# Patient Record
Sex: Male | Born: 1995 | Race: White | Hispanic: No | Marital: Single | Smoking: Current every day smoker
Health system: Southern US, Community
[De-identification: ages and names within clinical notes are randomized; demographics above are authoritative.]

---

## 2014-12-09 ENCOUNTER — Ambulatory Visit (INDEPENDENT_AMBULATORY_CARE_PROVIDER_SITE_OTHER): Payer: Worker's Compensation | Admitting: Emergency Medicine

## 2014-12-09 ENCOUNTER — Ambulatory Visit: Payer: Worker's Compensation

## 2014-12-09 VITALS — BP 128/80 | HR 95 | Temp 98.8°F | Resp 18 | Ht 70.25 in | Wt 178.4 lb

## 2014-12-09 DIAGNOSIS — S60212A Contusion of left wrist, initial encounter: Secondary | ICD-10-CM | POA: Diagnosis not present

## 2014-12-09 DIAGNOSIS — M25532 Pain in left wrist: Secondary | ICD-10-CM

## 2014-12-09 MED ORDER — NAPROXEN SODIUM 550 MG PO TABS: 550.0000 mg | ORAL_TABLET | Freq: Two times a day (BID) | ORAL | Status: AC

## 2014-12-09 NOTE — Progress Notes (Signed)
Subjective:  Patient ID: Colin Foley, male    DOB: 1995/06/26  Age: 19 y.o. MRN: [id number]  CC: Work Related Injury   HPI Colin Foley presents  with an injury to his left wrist. He was working on a machine pulled the guard up to access belts and it fell and struck the left wrist. This occurred yesterday. He has pain with movement of his wrist. He has no severe pain. Has some swelling and bruising. Denies taking any over-the-counter medication for pain.  History  Past medical family and social history noncontributory  Review of Systems  Constitutional: Negative for fever, chills and appetite change.  HENT: Negative for congestion, ear pain, postnasal drip, sinus pressure and sore throat.   Eyes: Negative for pain and redness.  Respiratory: Negative for cough, shortness of breath and wheezing.   Cardiovascular: Negative for leg swelling.  Gastrointestinal: Negative for nausea, vomiting, abdominal pain, diarrhea, constipation and blood in stool.  Endocrine: Negative for polyuria.  Genitourinary: Negative for dysuria, urgency, frequency and flank pain.  Musculoskeletal: Negative for gait problem.  Skin: Negative for rash.  Neurological: Negative for weakness and headaches.  Psychiatric/Behavioral: Negative for confusion and decreased concentration. The patient is not nervous/anxious.     Objective:  BP 128/80 mmHg  Pulse 95  Temp(Src) 98.8 F (37.1 C) (Oral)  Resp 18  Ht 5' 10.25" (1.784 m)  Wt 178 lb 6.4 oz (80.922 kg)  BMI 25.43 kg/m2  SpO2 99%  Physical Exam  Constitutional: He is oriented to person, place, and time. He appears well-developed and well-nourished. No distress.  HENT:  Head: Normocephalic and atraumatic.  Right Ear: External ear normal.  Left Ear: External ear normal.  Nose: Nose normal.  Eyes: Conjunctivae and EOM are normal. Pupils are equal, round, and reactive to light. No scleral icterus.  Neck: Normal range of motion. Neck supple. No tracheal  deviation present.  Cardiovascular: Normal rate, regular rhythm and normal heart sounds.   Pulmonary/Chest: Effort normal. No respiratory distress. He has no wheezes. He has no rales.  Abdominal: He exhibits no mass. There is no tenderness. There is no rebound and no guarding.  Musculoskeletal: He exhibits no edema.       Left wrist: He exhibits decreased range of motion, tenderness and swelling. He exhibits no deformity.  Lymphadenopathy:    He has no cervical adenopathy.  Neurological: He is alert and oriented to person, place, and time. Coordination normal.  Skin: Skin is warm and dry. No rash noted.  Psychiatric: He has a normal mood and affect. His behavior is normal.      Assessment & Plan:   Colin Foley was seen today for work related injury.  Diagnoses and all orders for this visit:  Left wrist pain -     DG Wrist Complete Left; Future  Contusion, wrist, left, initial encounter  Other orders -     naproxen sodium (ANAPROX DS) 550 MG tablet; Take 1 tablet (550 mg total) by mouth 2 (two) times daily with a meal.   I am having Colin Foley start on naproxen sodium.  Meds ordered this encounter  Medications  . naproxen sodium (ANAPROX DS) 550 MG tablet    Sig: Take 1 tablet (550 mg total) by mouth 2 (two) times daily with a meal.    Dispense:  40 tablet    Refill:  0    Appropriate red flag conditions were discussed with the patient as well as actions that should be taken.  Patient  expressed his understanding.  Follow-up: Return in 1 week (on 12/16/2014).  Carmelina Dane, MD   [facility] reading (PRIMARY) by  Dr. Dareen Piano negative.

## 2014-12-09 NOTE — Patient Instructions (Signed)
wWrist Pain Wrist injuries are frequent in adults and children. A sprain is an injury to the ligaments that hold your bones together. A strain is an injury to muscle or muscle cord-like structures (tendons) from stretching or pulling. Generally, when wrists are moderately tender to touch following a fall or injury, a break in the bone (fracture) may be present. Most wrist sprains or strains are better in 3 to 5 days, but complete healing may take several weeks. HOME CARE INSTRUCTIONS   Put ice on the injured area.  Put ice in a plastic bag.  Place a towel between your skin and the bag.  Leave the ice on for 15-20 minutes, 3-4 times a day, for the first 2 days, or as directed by your health care provider.  Keep your arm raised above the level of your heart whenever possible to reduce swelling and pain.  Rest the injured area for at least 48 hours or as directed by your health care provider.  If a splint or elastic bandage has been applied, use it for as long as directed by your health care provider or until seen by a health care provider for a follow-up exam.  Only take over-the-counter or prescription medicines for pain, discomfort, or fever as directed by your health care provider.  Keep all follow-up appointments. You may need to follow up with a specialist or have follow-up X-rays. Improvement in pain level is not a guarantee that you did not fracture a bone in your wrist. The only way to determine whether or not you have a broken bone is by X-ray. SEEK IMMEDIATE MEDICAL CARE IF:   Your fingers are swollen, very red, white, or cold and blue.  Your fingers are numb or tingling.  You have increasing pain.  You have difficulty moving your fingers. MAKE SURE YOU:   Understand these instructions.  Will watch your condition.  Will get help right away if you are not doing well or get worse. Document Released: 12/27/2004 Document Revised: 03/24/2013 Document Reviewed:  05/10/2010 [facility] Patient Information 2015 [place], [place]. This information is not intended to replace advice given to you by your health care provider. Make sure you discuss any questions you have with your health care provider.

## 2014-12-15 ENCOUNTER — Ambulatory Visit (INDEPENDENT_AMBULATORY_CARE_PROVIDER_SITE_OTHER): Payer: Worker's Compensation | Admitting: Emergency Medicine

## 2014-12-15 VITALS — BP 138/76 | HR 76 | Temp 98.2°F | Resp 14 | Ht 70.0 in | Wt 186.0 lb

## 2014-12-15 DIAGNOSIS — S60212D Contusion of left wrist, subsequent encounter: Secondary | ICD-10-CM | POA: Diagnosis not present

## 2014-12-15 NOTE — Progress Notes (Signed)
   Subjective:  Patient ID: Colin Foley, male    DOB: 03/08/1996  Age: 19 y.o. MRN: [id number]  CC: Follow-up   HPI Colin Foley presents  with a follow-up on her wrist injury. Colin Foley was struck with a machine guard on the left wrist a week ago and using a splint. Colin Foley's got full active and passive range of motion of the wrist is painless and Colin Foley request return to full duty at work. Colin Foley has no current complaint  History  Past medical family and social history are noncontributory  Review of Systems  Constitutional: Negative for fever, chills and appetite change.  HENT: Negative for congestion, ear pain, postnasal drip, sinus pressure and sore throat.   Eyes: Negative for pain and redness.  Respiratory: Negative for cough, shortness of breath and wheezing.   Cardiovascular: Negative for leg swelling.  Gastrointestinal: Negative for nausea, vomiting, abdominal pain, diarrhea, constipation and blood in stool.  Endocrine: Negative for polyuria.  Genitourinary: Negative for dysuria, urgency, frequency and flank pain.  Musculoskeletal: Negative for gait problem.  Skin: Negative for rash.  Neurological: Negative for weakness and headaches.  Psychiatric/Behavioral: Negative for confusion and decreased concentration. The patient is not nervous/anxious.     Objective:  BP 138/76 mmHg  Pulse 76  Temp(Src) 98.2 F (36.8 C) (Oral)  Resp 14  Ht  (1.778 m)  Wt 186 lb (84.369 kg)  BMI 26.69 kg/m2  SpO2 98%  Physical Exam  Constitutional: Colin Foley is oriented to person, place, and time. Colin Foley appears well-developed and well-nourished.  HENT:  Head: Normocephalic and atraumatic.  Eyes: Conjunctivae are normal. Pupils are equal, round, and reactive to light.  Pulmonary/Chest: Effort normal.  Musculoskeletal: Colin Foley exhibits no edema.  Neurological: Colin Foley is alert and oriented to person, place, and time.  Skin: Skin is dry.  Psychiatric: Colin Foley has a normal mood and affect. His behavior is normal. Thought content  normal.   His full active and passive range of motion is painless. Colin Foley has no tenderness ecchymosis or deformity or swelling.   Assessment & Plan:   Colin Foley was seen today for follow-up.  Diagnoses and all orders for this visit:  Contusion, wrist, left, subsequent encounter   I am having Colin Foley maintain his naproxen sodium.  No orders of the defined types were placed in this encounter.     Colin Foley was returned to full duty with discontinuation splint  Appropriate red flag conditions were discussed with the patient as well as actions that should be taken.  Patient expressed his understanding.  Follow-up: Return if symptoms worsen or fail to improve.  Carmelina Dane, MD

## 2014-12-15 NOTE — Patient Instructions (Signed)

## 2017-06-14 IMAGING — CR DG WRIST COMPLETE 3+V*L*
5 series · 5 of 5 positions shown · non-contrast
Comparison: None in PACs

CLINICAL DATA: Cracked trauma fall and she metal yesterday with
persistent ulna region left wrist pain.

EXAM:
LEFT WRIST - COMPLETE 3+ VIEW

[PA]
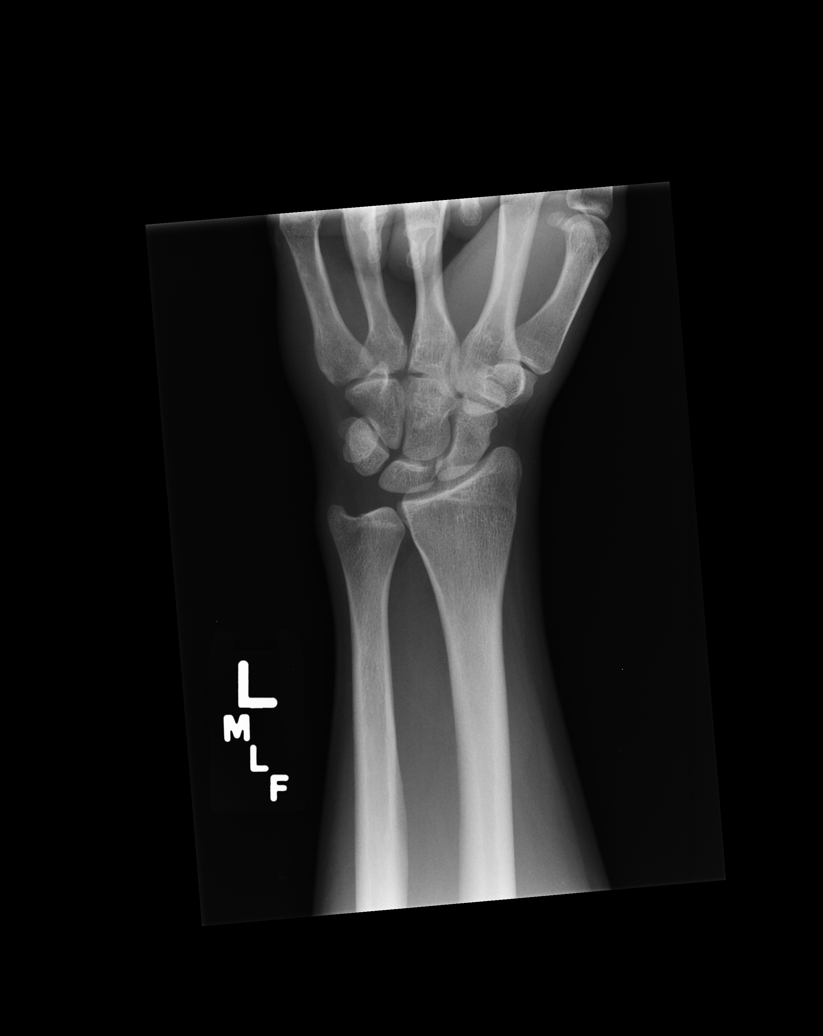

[pa int rot]
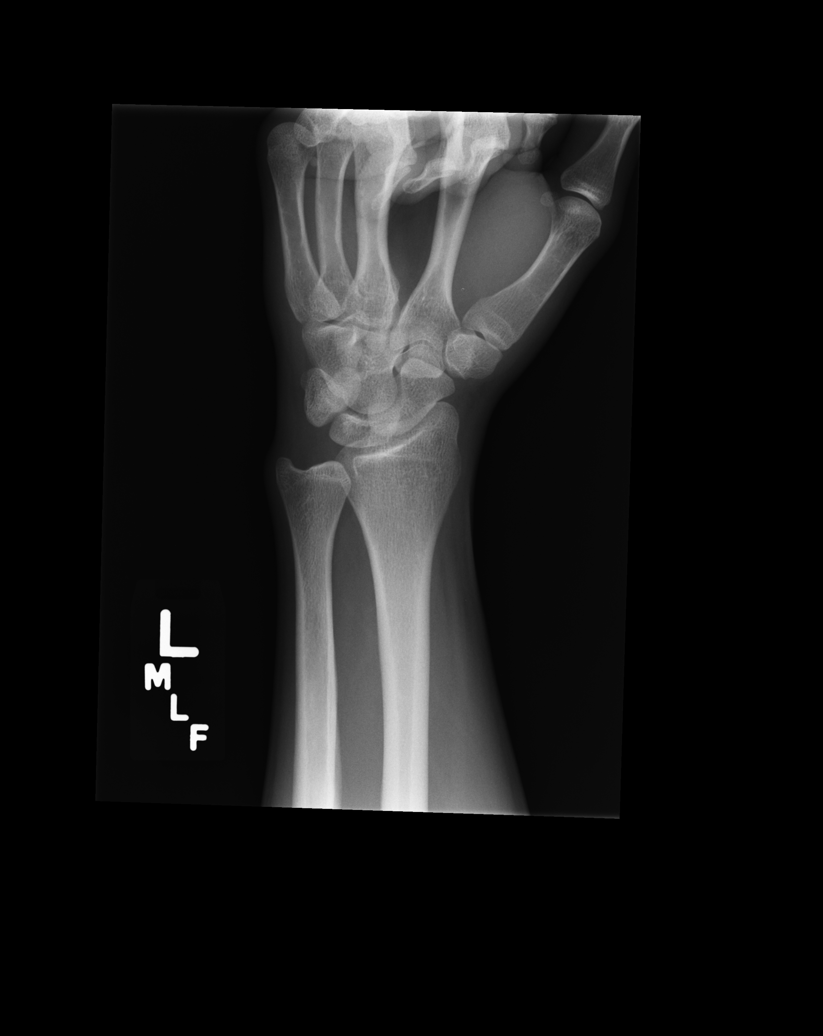

[lateral]
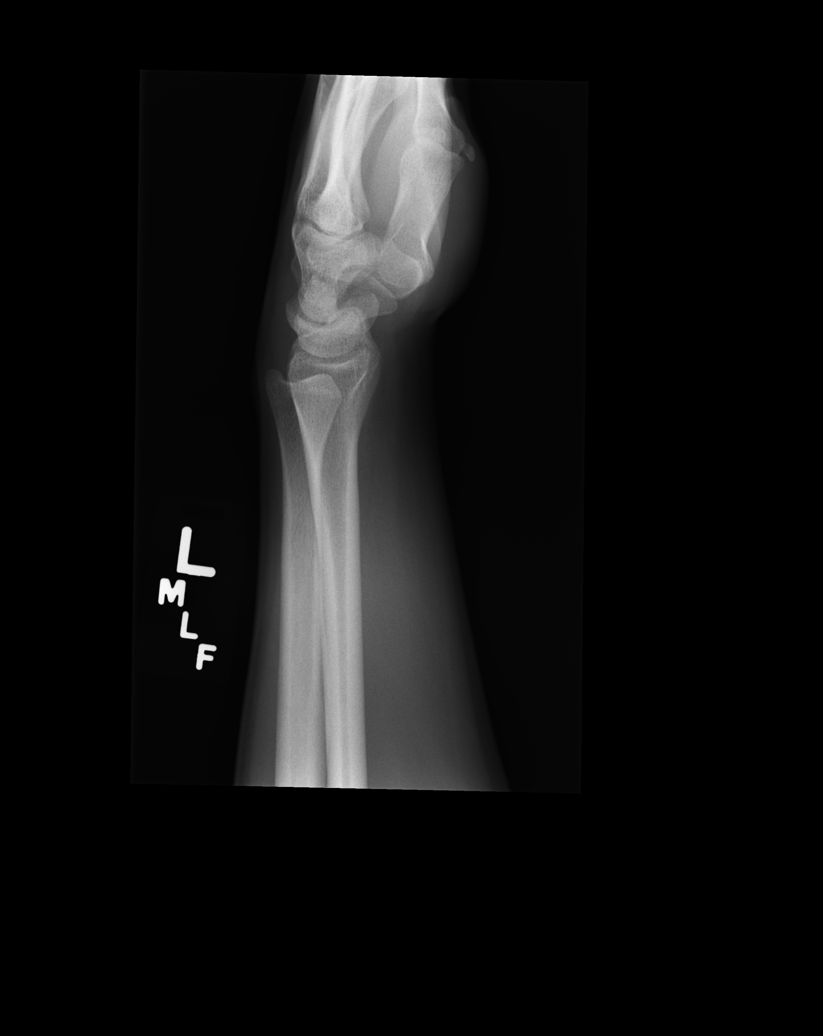

[ap ext rot]
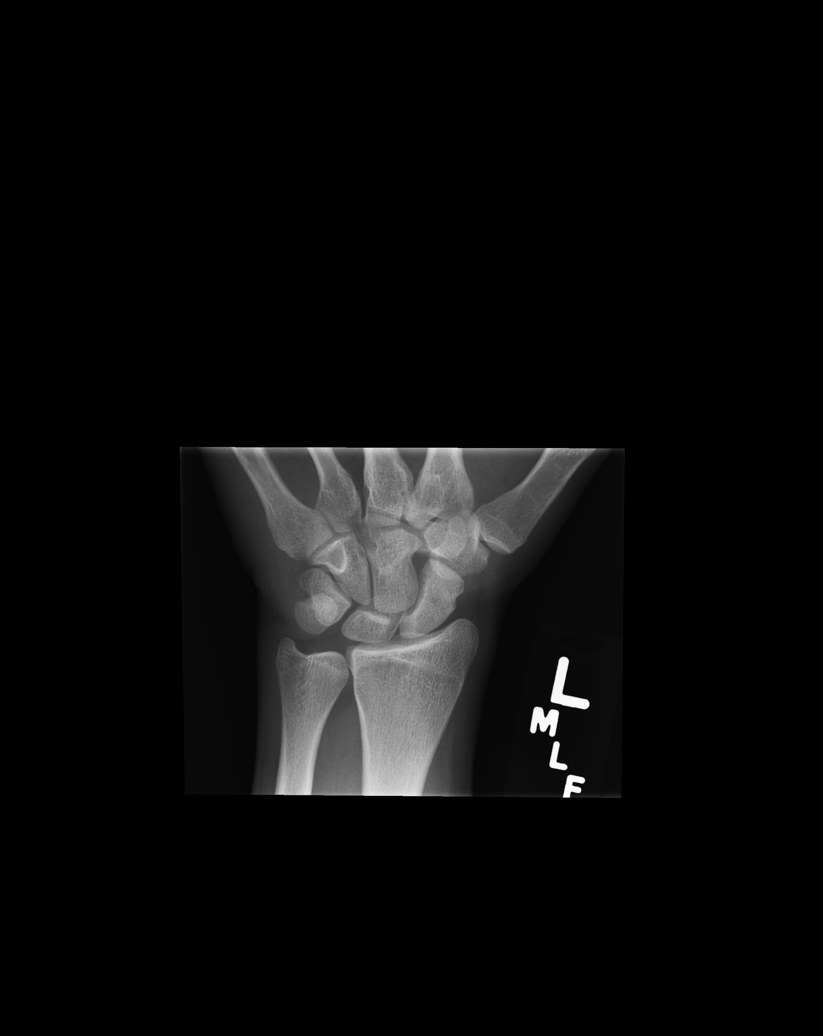

[pa navicular]
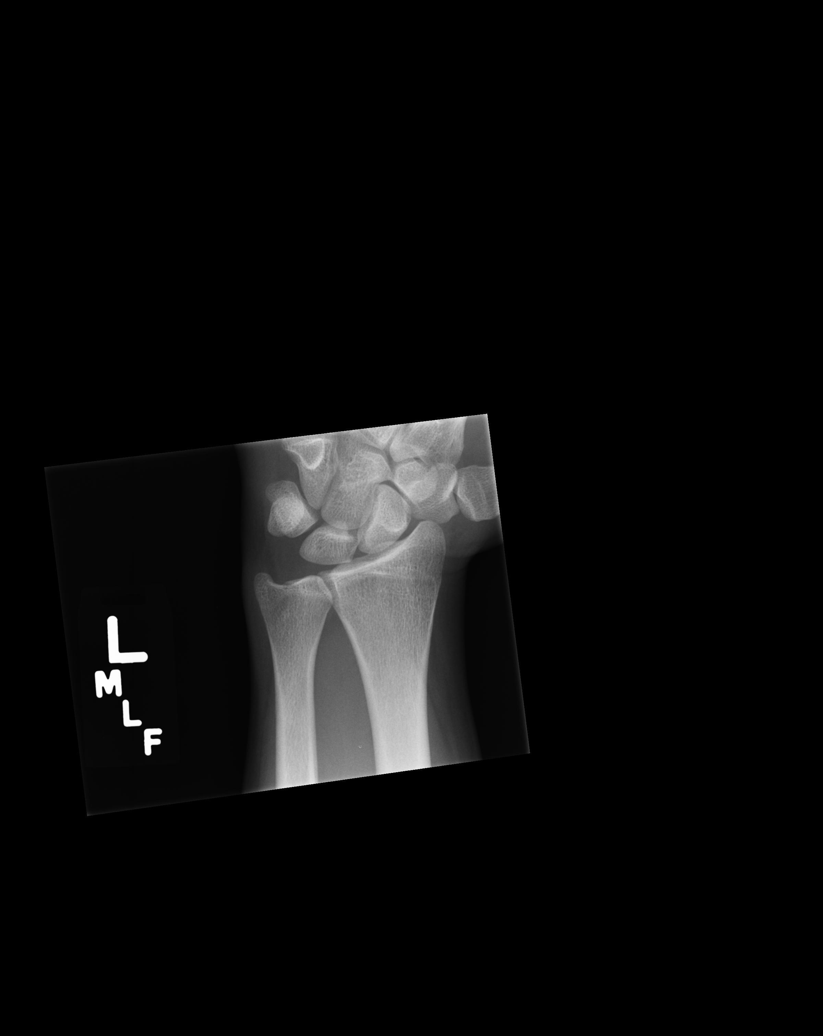

[5 of 5 positions shown; findings below may reference images not displayed]

FINDINGS: The bones are adequately mineralized. There is no acute fracture nor
dislocation. Specific attention to the ulnar aspect of the wrist
reveals no bony abnormality. The soft tissues are unremarkable.
IMPRESSION: There is no acute or significant chronic bony abnormality of the
left wrist.
